# Patient Record
Sex: Female | Born: 2000
Health system: Southern US, Community
[De-identification: ages and names within clinical notes are randomized; demographics above are authoritative.]

## PROBLEM LIST (undated history)

## (undated) DIAGNOSIS — R1013 Epigastric pain: Secondary | ICD-10-CM

## (undated) DIAGNOSIS — E669 Obesity, unspecified: Secondary | ICD-10-CM

## (undated) DIAGNOSIS — E301 Precocious puberty: Secondary | ICD-10-CM

## (undated) DIAGNOSIS — E785 Hyperlipidemia, unspecified: Secondary | ICD-10-CM

## (undated) DIAGNOSIS — E063 Autoimmune thyroiditis: Secondary | ICD-10-CM

## (undated) HISTORY — DX: Precocious puberty: E30.1

## (undated) HISTORY — PX: WISDOM TOOTH EXTRACTION: SHX21

## (undated) HISTORY — DX: Hyperlipidemia, unspecified: E78.5

## (undated) HISTORY — DX: Epigastric pain: R10.13

## (undated) HISTORY — DX: Obesity, unspecified: E66.9

## (undated) HISTORY — DX: Autoimmune thyroiditis: E06.3

---
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
## 2023-04-30 ENCOUNTER — Encounter (INDEPENDENT_AMBULATORY_CARE_PROVIDER_SITE_OTHER): Payer: Self-pay
